# Patient Record
Sex: Male | Born: 1995
Health system: Southern US, Community
[De-identification: ages and names within clinical notes are randomized; demographics above are authoritative.]

## PROBLEM LIST (undated history)

## (undated) DIAGNOSIS — R2231 Localized swelling, mass and lump, right upper limb: Secondary | ICD-10-CM

## (undated) DIAGNOSIS — J302 Other seasonal allergic rhinitis: Secondary | ICD-10-CM

## (undated) DIAGNOSIS — Z8709 Personal history of other diseases of the respiratory system: Secondary | ICD-10-CM

## (undated) HISTORY — PX: TYMPANOSTOMY TUBE PLACEMENT: SHX32

## (undated) HISTORY — PX: TYMPANOPLASTY WITH GRAFT: SHX6567

---
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
## 2015-02-14 ENCOUNTER — Encounter (HOSPITAL_COMMUNITY): Payer: Self-pay | Admitting: Emergency Medicine

## 2015-02-14 DIAGNOSIS — Z8659 Personal history of other mental and behavioral disorders: Secondary | ICD-10-CM | POA: Insufficient documentation

## 2015-02-14 DIAGNOSIS — J45909 Unspecified asthma, uncomplicated: Secondary | ICD-10-CM | POA: Insufficient documentation

## 2015-02-14 DIAGNOSIS — R112 Nausea with vomiting, unspecified: Secondary | ICD-10-CM | POA: Diagnosis not present

## 2015-02-14 DIAGNOSIS — R42 Dizziness and giddiness: Secondary | ICD-10-CM | POA: Insufficient documentation

## 2015-02-14 LAB — COMPREHENSIVE METABOLIC PANEL
ALK PHOS: 67 U/L (ref 38–126)
ALT: 51 U/L (ref 17–63)
AST: 41 U/L (ref 15–41)
Albumin: 4.2 g/dL (ref 3.5–5.0)
Anion gap: 10 (ref 5–15)
BILIRUBIN TOTAL: 0.5 mg/dL (ref 0.3–1.2)
BUN: 13 mg/dL (ref 6–20)
CHLORIDE: 102 mmol/L (ref 101–111)
CO2: 28 mmol/L (ref 22–32)
CREATININE: 1.05 mg/dL (ref 0.61–1.24)
Calcium: 9.6 mg/dL (ref 8.9–10.3)
GFR calc non Af Amer: >60 mL/min (ref >60)
GLUCOSE: 84 mg/dL (ref 65–99)
POTASSIUM: 3.7 mmol/L (ref 3.5–5.1)
SODIUM: 140 mmol/L (ref 135–145)
Total Protein: 7.3 g/dL (ref 6.5–8.1)

## 2015-02-14 LAB — CBC WITH DIFFERENTIAL/PLATELET
Basophils Absolute: 0.1 10*3/uL (ref 0.0–0.1)
Basophils Relative: 1 % (ref 0–1)
EOS ABS: 0.2 10*3/uL (ref 0.0–0.7)
EOS PCT: 2 % (ref 0–5)
HCT: 43.3 % (ref 39.0–52.0)
Hemoglobin: 15.5 g/dL (ref 13.0–17.0)
LYMPHS ABS: 4.6 10*3/uL — AB (ref 0.7–4.0)
LYMPHS PCT: 44 % (ref 12–46)
MCH: 30.3 pg (ref 26.0–34.0)
MCHC: 35.8 g/dL (ref 30.0–36.0)
MCV: 84.7 fL (ref 78.0–100.0)
MONOS PCT: 9 % (ref 3–12)
Monocytes Absolute: 0.9 10*3/uL (ref 0.1–1.0)
Neutro Abs: 4.6 10*3/uL (ref 1.7–7.7)
Neutrophils Relative %: 44 % (ref 43–77)
Platelets: 359 10*3/uL (ref 150–400)
RBC: 5.11 MIL/uL (ref 4.22–5.81)
RDW: 12.8 % (ref 11.5–15.5)
WBC: 10.4 10*3/uL (ref 4.0–10.5)

## 2015-02-14 NOTE — ED Notes (Signed)
Pt. reports emesis onset last night with mild dizziness , denies fever or chills , no diarrhea.

## 2015-02-15 ENCOUNTER — Emergency Department (HOSPITAL_COMMUNITY)
Admission: EM | Admit: 2015-02-15 | Discharge: 2015-02-15 | Disposition: A | Discharge status: Home / Self Care | Payer: BLUE CROSS/BLUE SHIELD | Attending: Emergency Medicine | Admitting: Emergency Medicine

## 2015-02-15 DIAGNOSIS — R42 Dizziness and giddiness: Secondary | ICD-10-CM

## 2015-02-15 MED ORDER — MECLIZINE HCL 25 MG PO TABS: 25.0000 mg | ORAL_TABLET | Freq: Three times a day (TID) | ORAL | Status: DC | PRN

## 2015-02-15 MED ORDER — ONDANSETRON 8 MG PO TBDP: 8.0000 mg | ORAL_TABLET | Freq: Three times a day (TID) | ORAL | Status: DC | PRN

## 2015-02-15 MED ORDER — MECLIZINE HCL 25 MG PO TABS
25.0000 mg | ORAL_TABLET | Freq: Once | ORAL | Status: AC
  Administered 2015-02-15: 25 mg via ORAL
  Filled 2015-02-15: qty 1

## 2015-02-15 MED ORDER — ONDANSETRON 4 MG PO TBDP
8.0000 mg | ORAL_TABLET | Freq: Once | ORAL | Status: AC
  Administered 2015-02-15: 8 mg via ORAL
  Filled 2015-02-15: qty 2

## 2015-02-15 NOTE — ED Provider Notes (Signed)
CSN: [id number]     Arrival date & time 02/14/15  2207 History   First MD Initiated Contact with Patient 02/15/15 0037     Chief Complaint  Patient presents with  . Emesis   HPI Patient presents to the emergency room with complaints of dizziness, nausea and vomiting. Symptoms started a couple days ago. He woke up feeling that objects in the room or moving. He also felt like his balance was off. Those symptoms would come and go throughout the day. He did not recall anything in particular that was bringing on the symptoms. There was not a certain position that precipitated the dizziness. Over the last couple days he's had some intermittent episodes of abdominal cramping but primarily nausea. He denies any diarrhea. He denies any fevers or chills. No recent falls or injuries. No trouble with his vision, speech, strength or sensation. Past Medical History  Diagnosis Date  . Asthma   . Allergy   . ADHD (attention deficit hyperactivity disorder)   . Allergy    Past Surgical History  Procedure Laterality Date  . Tympanostomy tube placement    . Skin graft    . Tubes in ears      No family history on file. History  Substance Use Topics  . Smoking status: Never Smoker   . Smokeless tobacco: Not on file  . Alcohol Use: No    Review of Systems  All other systems reviewed and are negative.     Allergies  Review of patient's allergies indicates no known allergies.  Home Medications   Prior to Admission medications   Not on File   BP 158/94 mmHg  Pulse 57  Temp(Src) 97.9 F (36.6 C) (Oral)  Resp 16  SpO2 99% Physical Exam  Constitutional: He appears well-developed and well-nourished. No distress.  HENT:  Head: Normocephalic and atraumatic.  Right Ear: External ear normal.  Left Ear: External ear normal.  Eyes: Conjunctivae are normal. Right eye exhibits no discharge. Left eye exhibits no discharge. No scleral icterus.  Neck: Neck supple. No tracheal deviation present.   Cardiovascular: Normal rate, regular rhythm and intact distal pulses.   Pulmonary/Chest: Effort normal and breath sounds normal. No stridor. No respiratory distress. He has no wheezes. He has no rales.  Abdominal: Soft. Bowel sounds are normal. He exhibits no distension. There is no tenderness. There is no rebound and no guarding.  Musculoskeletal: He exhibits no edema or tenderness.  Neurological: He is alert. He has normal strength. He displays no tremor. No cranial nerve deficit (no facial droop, extraocular movements intact, no slurred speech) or sensory deficit. He exhibits normal muscle tone. He displays no seizure activity. Coordination and gait normal.  Skin: Skin is warm and dry. No rash noted.  Psychiatric: He has a normal mood and affect.  Nursing note and vitals reviewed.   ED Course  Procedures (including critical care time) Labs Review Labs Reviewed  CBC WITH DIFFERENTIAL/PLATELET - Abnormal; Notable for the following:    Lymphs Abs 4.6 (*)    All other components within normal limits  COMPREHENSIVE METABOLIC PANEL   MDM   Final diagnoses:  Vertigo    Sx are suggestive of peripheral vertigo.  No focal neuro deficits on exam.  Doubt acute CNS event.  Will rx meclizine.  Zofran for nausea.  Follow up with PCP if sx not resolved 1 week    Linwood Dibbles, MD 02/15/15 [phone]

## 2015-02-15 NOTE — Discharge Instructions (Signed)
Benign Positional Vertigo °Vertigo means you feel like you or your surroundings are moving when they are not. Benign positional vertigo is the most common form of vertigo. Benign means that the cause of your condition is not serious. Benign positional vertigo is more common in older adults. °CAUSES  °Benign positional vertigo is the result of an upset in the labyrinth system. This is an area in the middle ear that helps control your balance. This may be caused by a viral infection, head injury, or repetitive motion. However, often no specific cause is found. °SYMPTOMS  °Symptoms of benign positional vertigo occur when you move your head or eyes in different directions. Some of the symptoms may include: °1. Loss of balance and falls. °2. Vomiting. °3. Blurred vision. °4. Dizziness. °5. Nausea. °6. Involuntary eye movements (nystagmus). °DIAGNOSIS  °Benign positional vertigo is usually diagnosed by physical exam. If the specific cause of your benign positional vertigo is unknown, your caregiver may perform imaging tests, such as magnetic resonance imaging (MRI) or computed tomography (CT). °TREATMENT  °Your caregiver may recommend movements or procedures to correct the benign positional vertigo. Medicines such as meclizine, benzodiazepines, and medicines for nausea may be used to treat your symptoms. In rare cases, if your symptoms are caused by certain conditions that affect the inner ear, you may need surgery. °HOME CARE INSTRUCTIONS  °· Follow your caregiver's instructions. °· Move slowly. Do not make sudden body or head movements. °· Avoid driving. °· Avoid operating heavy machinery. °· Avoid performing any tasks that would be dangerous to you or others during a vertigo episode. °· Drink enough fluids to keep your urine clear or pale yellow. °SEEK IMMEDIATE MEDICAL CARE IF:  °· You develop problems with walking, weakness, numbness, or using your arms, hands, or legs. °· You have difficulty speaking. °· You develop  severe headaches. °· Your nausea or vomiting continues or gets worse. °· You develop visual changes. °· Your family or friends notice any behavioral changes. °· Your condition gets worse. °· You have a fever. °· You develop a stiff neck or sensitivity to light. °MAKE SURE YOU:  °· Understand these instructions. °· Will watch your condition. °· Will get help right away if you are not doing well or get worse. °Document Released: 04/15/2006 Document Revised: 09/30/2011 Document Reviewed: 03/28/2011 °ExitCare® Patient Information ©2015 [employer]. This information is not intended to replace advice given to you by your health care provider. Make sure you discuss any questions you have with your health care provider. ° °Epley Maneuver Self-Care °WHAT IS THE EPLEY MANEUVER? °The Epley maneuver is an exercise you can do to relieve symptoms of benign paroxysmal positional vertigo (BPPV). This condition is often just referred to as vertigo. BPPV is caused by the movement of tiny crystals (canaliths) inside your inner ear. The accumulation and movement of canaliths in your inner ear causes a sudden spinning sensation (vertigo) when you move your head to certain positions. Vertigo usually lasts about 30 seconds. BPPV usually occurs in just one ear. If you get vertigo when you lie on your left side, you probably have BPPV in your left ear. Your health care provider can tell you which ear is involved.  °BPPV may be caused by a head injury. Many people older than 50 get BPPV for unknown reasons. If you have been diagnosed with BPPV, your health care provider may teach you how to do this maneuver. BPPV is not life threatening (benign) and usually goes away in time.  °  WHEN SHOULD I PERFORM THE EPLEY MANEUVER? °You can do this maneuver at home whenever you have symptoms of vertigo. You may do the Epley maneuver up to 3 times a day until your symptoms of vertigo go away. °HOW SHOULD I DO THE EPLEY MANEUVER? °7. Sit on the edge of a  bed or table with your back straight. Your legs should be extended or hanging over the edge of the bed or table.   °8. Turn your head halfway toward the affected ear.   °9. Lie backward quickly with your head turned until you are lying flat on your back. You may want to position a pillow under your shoulders.   °10. Hold this position for 30 seconds. You may experience an attack of vertigo. This is normal. Hold this position until the vertigo stops. °11. Then turn your head to the opposite direction until your unaffected ear is facing the floor.   °12. Hold this position for 30 seconds. You may experience an attack of vertigo. This is normal. Hold this position until the vertigo stops. °13. Now turn your whole body to the same side as your head. Hold for another 30 seconds.   °14. You can then sit back up. °ARE THERE RISKS TO THIS MANEUVER? °In some cases, you may have other symptoms (such as changes in your vision, weakness, or numbness). If you have these symptoms, stop doing the maneuver and call your health care provider. Even if doing these maneuvers relieves your vertigo, you may still have dizziness. Dizziness is the sensation of light-headedness but without the sensation of movement. Even though the Epley maneuver may relieve your vertigo, it is possible that your symptoms will return within 5 years. °WHAT SHOULD I DO AFTER THIS MANEUVER? °After doing the Epley maneuver, you can return to your normal activities. Ask your doctor if there is anything you should do at home to prevent vertigo. This may include: °· Sleeping with two or more pillows to keep your head elevated. °· Not sleeping on the side of your affected ear. °· Getting up slowly from bed. °· Avoiding sudden movements during the day. °· Avoiding extreme head movement, like looking up or bending over. °· Wearing a cervical collar to prevent sudden head movements. °WHAT SHOULD I DO IF MY SYMPTOMS GET WORSE? °Call your health care provider if your  vertigo gets worse. Call your provider right way if you have other symptoms, including:  °· Nausea. °· Vomiting. °· Headache. °· Weakness. °· Numbness. °· Vision changes. °Document Released: 07/13/2013 Document Reviewed: 07/13/2013 °ExitCare® Patient Information ©2015 [employer]. This information is not intended to replace advice given to you by your health care provider. Make sure you discuss any questions you have with your health care provider. ° °

## 2015-08-04 ENCOUNTER — Encounter (HOSPITAL_COMMUNITY): Payer: Self-pay | Admitting: Family Medicine

## 2015-08-04 ENCOUNTER — Emergency Department (HOSPITAL_COMMUNITY)
Admission: EM | Admit: 2015-08-04 | Discharge: 2015-08-04 | Disposition: A | Discharge status: Home / Self Care | Payer: Worker's Compensation | Attending: Emergency Medicine | Admitting: Emergency Medicine

## 2015-08-04 DIAGNOSIS — Y9289 Other specified places as the place of occurrence of the external cause: Secondary | ICD-10-CM | POA: Diagnosis not present

## 2015-08-04 DIAGNOSIS — S61231A Puncture wound without foreign body of left index finger without damage to nail, initial encounter: Secondary | ICD-10-CM | POA: Insufficient documentation

## 2015-08-04 DIAGNOSIS — S6992XA Unspecified injury of left wrist, hand and finger(s), initial encounter: Secondary | ICD-10-CM | POA: Diagnosis present

## 2015-08-04 DIAGNOSIS — Y9389 Activity, other specified: Secondary | ICD-10-CM | POA: Diagnosis not present

## 2015-08-04 DIAGNOSIS — S61412A Laceration without foreign body of left hand, initial encounter: Secondary | ICD-10-CM | POA: Insufficient documentation

## 2015-08-04 DIAGNOSIS — Y998 Other external cause status: Secondary | ICD-10-CM | POA: Insufficient documentation

## 2015-08-04 DIAGNOSIS — W311XXA Contact with metalworking machines, initial encounter: Secondary | ICD-10-CM | POA: Diagnosis not present

## 2015-08-04 MED ORDER — CEPHALEXIN 500 MG PO CAPS: 500.0000 mg | ORAL_CAPSULE | Freq: Two times a day (BID) | ORAL | Status: DC

## 2015-08-04 MED ORDER — LIDOCAINE HCL 2 % IJ SOLN
20.0000 mL | Freq: Once | INTRAMUSCULAR | Status: AC
  Administered 2015-08-04: 400 mg
  Filled 2015-08-04: qty 20

## 2015-08-04 NOTE — ED Provider Notes (Signed)
By signing my name below, I, Brent Gilmore, attest that this documentation has been prepared under the direction and in the presence of [name], PA-C Electronically Signed: [name], ED Scribe. 08/04/2015. 3:06 PM.  Patient is a 20 y.o. male presenting with hand injury. The history is provided by the patient.  Hand Injury Location:  Hand Hand location:  L palm  Shed L. Brent Gilmore is a 20 y.o. male who presents to the Emergency Department complaining of left hand injury occurring PTA. He was drilling through a plate and holding it from below. The drill bit punched through the other side and into his left index finger. The puncture wound is at the base of the palmar aspect of his finger. He denies weakness, numbness, tingling or loss of sensation of the digit. Pt was seen by Dr. [name] at occupational health clinic who called Dr. [name]. Dr. [name] instructed pt to come to ED and he will be evaluated by him here. Pt didn't have any xrays at the time of the incident. He notes that he has tried applying pressure to the area for the relief of his symptoms. Bleeding was easily controlled with pressure. He has no other complaints at this time.   Review of Systems  Skin:       Wound  All other systems reviewed and are negative.   Physical Exam  Constitutional: He is well-developed, well-nourished, and in no distress.  HENT:  Head: Normocephalic and atraumatic.  Eyes: Conjunctivae are normal. Right eye exhibits no discharge. Left eye exhibits no discharge.  Neck: Normal range of motion.  Cardiovascular: Normal rate and intact distal pulses.   Cap refill < 3 seconds  Pulmonary/Chest: Effort normal. No respiratory distress.  Musculoskeletal: Normal range of motion.       Left hand: He exhibits tenderness and laceration. He exhibits normal range of motion, normal capillary refill and no deformity. Normal sensation noted. Normal strength noted.       Hands: FROM of digits and wrist of left hand.  Tenderness over site of puncture wound. No swelling or deformity. Sensation intact over the digit.   Neurological: He is alert. Coordination normal. GCS score is 15.  Skin: Skin is warm and dry.  1 cm puncture wound noted to palmar aspect at base of left index finger. Bleeding controlled with pressure. No foreign bodies visualized.   Psychiatric: Mood and affect normal.  Nursing note and vitals reviewed.   DIAGNOSTIC STUDIES: Oxygen Saturation is 100% on RA, nl by my interpretation.    COORDINATION OF CARE: 3:06 PM Discussed treatment plan with pt at bedside which includes consult to Dr. [name] and pt agreed to plan.   MDM Number of Diagnoses or Management Options Hand laceration, left, initial encounter:   20 year old male presenting with puncture wound to left hand at base of index finger on palmar aspect. Pt initially seen by his occupational health Dr. [name] who consulted hand surgery. Dr. [name] recommended pt transferred to ED for evaluation. Dr. [name] in ED at time of patient's arrival. Patient assessed and wound repaired by him. Dr. [name] recommends keflex and outpatient follow up. Return precautions given in discharge paperwork and discussed with pt at bedside. Pt stable for discharge  I personally performed the services described in this documentation, which was scribed in my presence. The recorded information has been reviewed and is accurate.    Rolm GalaStevi Spencer [name], PA-C 08/04/15 1805  [name], MD 08/05/15 [phone]

## 2015-08-04 NOTE — Discharge Instructions (Signed)

## 2015-08-04 NOTE — ED Notes (Signed)
Pt here for left pointer index finger injury. sts a drill bit went into finger. Pt sensory in tact. Sent here to see Dr. [name].

## 2015-08-04 NOTE — ED Notes (Signed)
Dr Izora Ribascoley at the bedside

## 2016-05-10 DIAGNOSIS — M79644 Pain in right finger(s): Secondary | ICD-10-CM | POA: Diagnosis not present

## 2016-05-10 DIAGNOSIS — M67441 Ganglion, right hand: Secondary | ICD-10-CM | POA: Diagnosis not present

## 2016-05-31 DIAGNOSIS — R2231 Localized swelling, mass and lump, right upper limb: Secondary | ICD-10-CM | POA: Diagnosis not present

## 2016-05-31 DIAGNOSIS — R229 Localized swelling, mass and lump, unspecified: Secondary | ICD-10-CM | POA: Diagnosis not present

## 2016-05-31 DIAGNOSIS — M6740 Ganglion, unspecified site: Secondary | ICD-10-CM | POA: Diagnosis not present

## 2016-06-03 ENCOUNTER — Other Ambulatory Visit: Payer: Self-pay | Admitting: Orthopedic Surgery

## 2016-06-03 DIAGNOSIS — IMO0002 Reserved for concepts with insufficient information to code with codable children: Secondary | ICD-10-CM

## 2016-06-03 DIAGNOSIS — R229 Localized swelling, mass and lump, unspecified: Principal | ICD-10-CM

## 2016-06-21 ENCOUNTER — Ambulatory Visit
Admission: RE | Admit: 2016-06-21 | Discharge: 2016-06-21 | Disposition: A | Discharge status: Home / Self Care | Payer: BLUE CROSS/BLUE SHIELD | Source: Ambulatory Visit | Attending: Orthopedic Surgery | Admitting: Orthopedic Surgery

## 2016-06-21 DIAGNOSIS — R229 Localized swelling, mass and lump, unspecified: Principal | ICD-10-CM

## 2016-06-21 DIAGNOSIS — IMO0002 Reserved for concepts with insufficient information to code with codable children: Secondary | ICD-10-CM

## 2016-06-21 DIAGNOSIS — R2231 Localized swelling, mass and lump, right upper limb: Secondary | ICD-10-CM

## 2016-06-21 HISTORY — DX: Localized swelling, mass and lump, right upper limb: R22.31

## 2016-06-21 MED ORDER — GADOBENATE DIMEGLUMINE 529 MG/ML IV SOLN
20.0000 mL | Freq: Once | INTRAVENOUS | Status: AC | PRN
  Administered 2016-06-21: 20 mL via INTRAVENOUS

## 2016-06-28 ENCOUNTER — Encounter (HOSPITAL_BASED_OUTPATIENT_CLINIC_OR_DEPARTMENT_OTHER): Payer: Self-pay | Admitting: *Deleted

## 2016-06-28 ENCOUNTER — Other Ambulatory Visit: Payer: Self-pay | Admitting: Orthopedic Surgery

## 2016-06-28 DIAGNOSIS — R229 Localized swelling, mass and lump, unspecified: Secondary | ICD-10-CM | POA: Diagnosis not present

## 2016-07-02 ENCOUNTER — Ambulatory Visit (HOSPITAL_BASED_OUTPATIENT_CLINIC_OR_DEPARTMENT_OTHER)
Admission: RE | Admit: 2016-07-02 | Discharge: 2016-07-02 | Disposition: A | Discharge status: Home / Self Care | Payer: BLUE CROSS/BLUE SHIELD | Source: Ambulatory Visit | Attending: Orthopedic Surgery | Admitting: Orthopedic Surgery

## 2016-07-02 ENCOUNTER — Encounter (HOSPITAL_BASED_OUTPATIENT_CLINIC_OR_DEPARTMENT_OTHER)
Admission: RE | Disposition: A | Discharge status: Home / Self Care | Payer: Self-pay | Source: Ambulatory Visit | Attending: Orthopedic Surgery

## 2016-07-02 ENCOUNTER — Encounter (HOSPITAL_BASED_OUTPATIENT_CLINIC_OR_DEPARTMENT_OTHER): Payer: Self-pay | Admitting: Anesthesiology

## 2016-07-02 ENCOUNTER — Ambulatory Visit (HOSPITAL_BASED_OUTPATIENT_CLINIC_OR_DEPARTMENT_OTHER): Payer: BLUE CROSS/BLUE SHIELD | Admitting: Anesthesiology

## 2016-07-02 DIAGNOSIS — I82601 Acute embolism and thrombosis of unspecified veins of right upper extremity: Secondary | ICD-10-CM | POA: Diagnosis not present

## 2016-07-02 DIAGNOSIS — R2231 Localized swelling, mass and lump, right upper limb: Secondary | ICD-10-CM | POA: Diagnosis not present

## 2016-07-02 DIAGNOSIS — D1809 Hemangioma of other sites: Secondary | ICD-10-CM | POA: Diagnosis not present

## 2016-07-02 DIAGNOSIS — E669 Obesity, unspecified: Secondary | ICD-10-CM | POA: Diagnosis not present

## 2016-07-02 DIAGNOSIS — Z6836 Body mass index (BMI) 36.0-36.9, adult: Secondary | ICD-10-CM | POA: Insufficient documentation

## 2016-07-02 HISTORY — DX: Other seasonal allergic rhinitis: J30.2

## 2016-07-02 HISTORY — PX: MASS EXCISION: SHX2000

## 2016-07-02 HISTORY — DX: Localized swelling, mass and lump, right upper limb: R22.31

## 2016-07-02 HISTORY — DX: Personal history of other diseases of the respiratory system: Z87.09

## 2016-07-02 SURGERY — EXCISION MASS
Anesthesia: Regional | Site: Thumb | Laterality: Right

## 2016-07-02 MED ORDER — MIDAZOLAM HCL 2 MG/2ML IJ SOLN
INTRAMUSCULAR | Status: AC
  Filled 2016-07-02: qty 2

## 2016-07-02 MED ORDER — SCOPOLAMINE 1 MG/3DAYS TD PT72: 1.0000 | MEDICATED_PATCH | Freq: Once | TRANSDERMAL | Status: DC | PRN

## 2016-07-02 MED ORDER — HYDROCODONE-ACETAMINOPHEN 5-325 MG PO TABS
1.0000 | ORAL_TABLET | Freq: Four times a day (QID) | ORAL | 0 refills | Status: DC | PRN
Start: 2016-07-02 — End: 2017-01-31

## 2016-07-02 MED ORDER — PROPOFOL 10 MG/ML IV BOLUS
INTRAVENOUS | Status: AC
  Filled 2016-07-02: qty 20

## 2016-07-02 MED ORDER — CHLORHEXIDINE GLUCONATE 4 % EX LIQD
60.0000 mL | Freq: Once | CUTANEOUS | Status: DC
Start: 2016-07-02 — End: 2016-07-02

## 2016-07-02 MED ORDER — BUPIVACAINE HCL (PF) 0.25 % IJ SOLN
INTRAMUSCULAR | Status: DC | PRN
  Administered 2016-07-02: 6 mL

## 2016-07-02 MED ORDER — LACTATED RINGERS IV SOLN
INTRAVENOUS | Status: DC
  Administered 2016-07-02: 12:00:00 via INTRAVENOUS

## 2016-07-02 MED ORDER — HYDROCODONE-ACETAMINOPHEN 7.5-325 MG PO TABS: 1.0000 | ORAL_TABLET | Freq: Once | ORAL | Status: DC | PRN

## 2016-07-02 MED ORDER — FENTANYL CITRATE (PF) 100 MCG/2ML IJ SOLN: 25.0000 ug | INTRAMUSCULAR | Status: DC | PRN

## 2016-07-02 MED ORDER — LIDOCAINE HCL (PF) 0.5 % IJ SOLN
INTRAMUSCULAR | Status: DC | PRN
  Administered 2016-07-02: 30 mL via INTRAVENOUS

## 2016-07-02 MED ORDER — MIDAZOLAM HCL 2 MG/2ML IJ SOLN
1.0000 mg | INTRAMUSCULAR | Status: DC | PRN
  Administered 2016-07-02: 2 mg via INTRAVENOUS

## 2016-07-02 MED ORDER — CEFAZOLIN SODIUM-DEXTROSE 2-4 GM/100ML-% IV SOLN
2.0000 g | INTRAVENOUS | Status: AC
  Administered 2016-07-02: 2 g via INTRAVENOUS

## 2016-07-02 MED ORDER — FENTANYL CITRATE (PF) 100 MCG/2ML IJ SOLN
50.0000 ug | INTRAMUSCULAR | Status: DC | PRN
Start: 2016-07-02 — End: 2016-07-02
  Administered 2016-07-02: 100 ug via INTRAVENOUS

## 2016-07-02 MED ORDER — FENTANYL CITRATE (PF) 100 MCG/2ML IJ SOLN
INTRAMUSCULAR | Status: AC
  Filled 2016-07-02: qty 2

## 2016-07-02 MED ORDER — CEFAZOLIN SODIUM-DEXTROSE 2-4 GM/100ML-% IV SOLN
INTRAVENOUS | Status: AC
  Filled 2016-07-02: qty 100

## 2016-07-02 MED ORDER — PROPOFOL 10 MG/ML IV BOLUS
INTRAVENOUS | Status: DC | PRN
  Administered 2016-07-02: 20 mg via INTRAVENOUS
  Administered 2016-07-02 (×2): 50 mg via INTRAVENOUS

## 2016-07-02 MED ORDER — ONDANSETRON HCL 4 MG/2ML IJ SOLN: 4.0000 mg | Freq: Once | INTRAMUSCULAR | Status: DC | PRN

## 2016-07-02 MED ORDER — ONDANSETRON HCL 4 MG/2ML IJ SOLN
INTRAMUSCULAR | Status: DC | PRN
  Administered 2016-07-02: 4 mg via INTRAVENOUS

## 2016-07-02 SURGICAL SUPPLY — 42 items
BANDAGE COBAN STERILE 2 (GAUZE/BANDAGES/DRESSINGS) IMPLANT
BLADE SURG 15 STRL LF DISP TIS (BLADE) ×1 IMPLANT
BLADE SURG 15 STRL SS (BLADE) ×2
BNDG CMPR 9X4 STRL LF SNTH (GAUZE/BANDAGES/DRESSINGS)
BNDG COHESIVE 1X5 TAN STRL LF (GAUZE/BANDAGES/DRESSINGS) ×1 IMPLANT
BNDG COHESIVE 3X5 TAN STRL LF (GAUZE/BANDAGES/DRESSINGS) IMPLANT
BNDG ESMARK 4X9 LF (GAUZE/BANDAGES/DRESSINGS) IMPLANT
BNDG GAUZE ELAST 4 BULKY (GAUZE/BANDAGES/DRESSINGS) IMPLANT
CHLORAPREP W/TINT 26ML (MISCELLANEOUS) ×2 IMPLANT
CORDS BIPOLAR (ELECTRODE) ×2 IMPLANT
COVER BACK TABLE 60X90IN (DRAPES) ×2 IMPLANT
COVER MAYO STAND STRL (DRAPES) ×2 IMPLANT
CUFF TOURNIQUET SINGLE 18IN (TOURNIQUET CUFF) ×1 IMPLANT
DECANTER SPIKE VIAL GLASS SM (MISCELLANEOUS) IMPLANT
DRAIN PENROSE 1/2X12 LTX STRL (WOUND CARE) IMPLANT
DRAPE EXTREMITY T 121X128X90 (DRAPE) ×2 IMPLANT
DRAPE SURG 17X23 STRL (DRAPES) ×2 IMPLANT
GAUZE SPONGE 4X4 12PLY STRL (GAUZE/BANDAGES/DRESSINGS) ×2 IMPLANT
GAUZE XEROFORM 1X8 LF (GAUZE/BANDAGES/DRESSINGS) ×2 IMPLANT
GLOVE BIOGEL PI IND STRL 8.5 (GLOVE) ×1 IMPLANT
GLOVE BIOGEL PI INDICATOR 8.5 (GLOVE) ×1
GLOVE SURG ORTHO 8.0 STRL STRW (GLOVE) ×2 IMPLANT
GOWN STRL REUS W/ TWL LRG LVL3 (GOWN DISPOSABLE) ×1 IMPLANT
GOWN STRL REUS W/TWL LRG LVL3 (GOWN DISPOSABLE) ×2
GOWN STRL REUS W/TWL XL LVL3 (GOWN DISPOSABLE) ×2 IMPLANT
NDL PRECISIONGLIDE 27X1.5 (NEEDLE) ×1 IMPLANT
NDL SAFETY ECLIPSE 18X1.5 (NEEDLE) IMPLANT
NEEDLE HYPO 18GX1.5 SHARP (NEEDLE)
NEEDLE PRECISIONGLIDE 27X1.5 (NEEDLE) ×2 IMPLANT
NS IRRIG 1000ML POUR BTL (IV SOLUTION) ×2 IMPLANT
PACK BASIN DAY SURGERY FS (CUSTOM PROCEDURE TRAY) ×2 IMPLANT
PAD CAST 3X4 CTTN HI CHSV (CAST SUPPLIES) IMPLANT
PADDING CAST COTTON 3X4 STRL (CAST SUPPLIES)
SPLINT PLASTER CAST XFAST 3X15 (CAST SUPPLIES) IMPLANT
SPLINT PLASTER XTRA FASTSET 3X (CAST SUPPLIES)
STOCKINETTE 4X48 STRL (DRAPES) ×2 IMPLANT
SUT ETHILON 4 0 PS 2 18 (SUTURE) ×2 IMPLANT
SUT VIC AB 4-0 P2 18 (SUTURE) IMPLANT
SYR BULB 3OZ (MISCELLANEOUS) ×2 IMPLANT
SYR CONTROL 10ML LL (SYRINGE) ×2 IMPLANT
TOWEL OR 17X24 6PK STRL BLUE (TOWEL DISPOSABLE) ×2 IMPLANT
UNDERPAD 30X30 (UNDERPADS AND DIAPERS) ×2 IMPLANT

## 2016-07-02 NOTE — Anesthesia Preprocedure Evaluation (Addendum)
Anesthesia Evaluation  Patient identified by MRN, date of birth, ID band Patient awake    Reviewed: Allergy & Precautions, NPO status , Patient's Chart, lab work & pertinent test results  Airway Mallampati: II  TM Distance: >3 FB Neck ROM: Full    Dental  (+) Teeth Intact   Pulmonary neg pulmonary ROS,    Pulmonary exam normal breath sounds clear to auscultation       Cardiovascular negative cardio ROS Normal cardiovascular exam Rhythm:Regular Rate:Normal     Neuro/Psych negative neurological ROS  negative psych ROS   GI/Hepatic negative GI ROS, Neg liver ROS,   Endo/Other  Obesity  Renal/GU negative Renal ROS  negative genitourinary   Musculoskeletal Mass right thumb   Abdominal (+) + obese,   Peds  Hematology negative hematology ROS (+)   Anesthesia Other Findings   Reproductive/Obstetrics                            Anesthesia Physical Anesthesia Plan  ASA: II  Anesthesia Plan: Bier Block   Post-op Pain Management:    Induction: Intravenous  Airway Management Planned: Natural Airway and Nasal Cannula  Additional Equipment:   Intra-op Plan:   Post-operative Plan:   Informed Consent: I have reviewed the patients History and Physical, chart, labs and discussed the procedure including the risks, benefits and alternatives for the proposed anesthesia with the patient or authorized representative who has indicated his/her understanding and acceptance.     Plan Discussed with: CRNA, Anesthesiologist and Surgeon  Anesthesia Plan Comments:         Anesthesia Quick Evaluation

## 2016-07-02 NOTE — Op Note (Signed)
  Dictation Number [id number]

## 2016-07-02 NOTE — Discharge Instructions (Addendum)
[facility] Instructions °Hand Surgery ° °Wound Care: °Keep your hand elevated above the level of your heart.  Do not allow it to dangle by your side.  Keep the dressing dry and do not remove it unless your doctor advises you to do so.  He will usually change it at the time of your post-op visit.  Moving your fingers is advised to stimulate circulation but will depend on the site of your surgery.  If you have a splint applied, your doctor will advise you regarding movement. ° °Activity: °Do not drive or operate machinery today.  Rest today and then you may return to your normal activity and work as indicated by your physician. ° °Diet:  °Drink liquids today or eat a light diet.  You may resume a regular diet tomorrow.   ° ° ° °Post Anesthesia Home Care Instructions ° °Activity: °Get plenty of rest for the remainder of the day. A responsible adult should stay with you for 24 hours following the procedure.  °For the next 24 hours, DO NOT: °-Drive a car °-Operate machinery °-Drink alcoholic beverages °-Take any medication unless instructed by your physician °-Make any legal decisions or sign important papers. ° °Meals: °Start with liquid foods such as gelatin or soup. Progress to regular foods as tolerated. Avoid greasy, spicy, heavy foods. If nausea and/or vomiting occur, drink only clear liquids until the nausea and/or vomiting subsides. Call your physician if vomiting continues. ° °Special Instructions/Symptoms: °Your throat may feel dry or sore from the anesthesia or the breathing tube placed in your throat during surgery. If this causes discomfort, gargle with warm salt water. The discomfort should disappear within 24 hours. ° °If you had a scopolamine patch placed behind your ear for the management of post- operative nausea and/or vomiting: ° °1. The medication in the patch is effective for 72 hours, after which it should be removed.  Wrap patch in a tissue and discard in the trash. Wash hands thoroughly with  soap and water. °2. You may remove the patch earlier than 72 hours if you experience unpleasant side effects which may include dry mouth, dizziness or visual disturbances. °3. Avoid touching the patch. Wash your hands with soap and water after contact with the patch. °  ° °General expectations: °Pain for two to three days. °Fingers may become slightly swollen. ° °Call your doctor if any of the following occur: °Severe pain not relieved by pain medication. °Elevated temperature. °Dressing soaked with blood. °Inability to move fingers. °White or bluish color to fingers. ° °

## 2016-07-02 NOTE — Transfer of Care (Signed)
Immediate Anesthesia Transfer of Care Note  Patient: Brent Gilmore  Procedure(s) Performed: Procedure(s) with comments: EXCISION MASS right thumb (Right) - FAB  Patient Location: PACU  Anesthesia Type:MAC and Bier block  Level of Consciousness: awake, alert  and oriented  Airway & Oxygen Therapy: Patient Spontanous Breathing and Patient connected to face mask oxygen  Post-op Assessment: Report given to RN and Post -op Vital signs reviewed and stable  Post vital signs: Reviewed and stable  Last Vitals:  Vitals:   07/02/16 1145  BP: (!) 148/93  Pulse: 64  Resp: 18  Temp: 36.6 C    Last Pain:  Vitals:   07/02/16 1145  TempSrc: Oral      Patients Stated Pain Goal: 0 (07/02/16 1145)  Complications: No apparent anesthesia complications

## 2016-07-02 NOTE — Brief Op Note (Signed)
07/02/2016  1:50 PM  PATIENT:  Walden Fieldameron L Taft  20 y.o. male  PRE-OPERATIVE DIAGNOSIS:  mass right thumb  POST-OPERATIVE DIAGNOSIS:  mass right thumb  PROCEDURE:  Procedure(s) with comments: EXCISION MASS right thumb (Right) - FAB  SURGEON:  Surgeon(s) and Role:    * Cindee SaltGary Tiffeny Minchew, MD - Primary  PHYSICIAN ASSISTANT:   ASSISTANTS: none   ANESTHESIA:   local and regional  EBL:  Total I/O In: 300 [I.V.:300] Out: -   BLOOD ADMINISTERED:none  DRAINS: none   LOCAL MEDICATIONS USED:  BUPIVICAINE   SPECIMEN:  Excision  DISPOSITION OF SPECIMEN:  PATHOLOGY  COUNTS:  YES  TOURNIQUET:   Total Tourniquet Time Documented: Forearm (Right) - 17 minutes Total: Forearm (Right) - 17 minutes   DICTATION: .Other Dictation: Dictation Number [id number]  PLAN OF CARE: Discharge to home after PACU  PATIENT DISPOSITION:  PACU - hemodynamically stable.

## 2016-07-02 NOTE — H&P (Signed)
  Brent Gilmore is an 20 y.o. male.   Chief Complaint: massright thumb HPI: Brent LangCameron is a 20 year old right-hand-dominant male who comes in with a complaint of a mass on the proximal phalanx of his right thumb. He states this been present for the past 7-8 years. He thinks that it occurred while playing football. It is swollen recently was causing pain to the extent that it was aspirated and went to the urgent care centers. This returned only blood and clot. He complains of a sharp pain which has essentially resolved with a VAS score of 8/10. He is not taking anything for this. He states that it is gradually been enlarging. Is not complaining of any numbness or tingling. He has no history of diabetes thyroid problems arthritis or gout. Family history is positive diabetes and thyroid problems negative for arthritis and gout. He has been tested for diabetes but not recently. He has had his MRI done.His MRI is reviewed with him this was read by Dr. Jena GaussMaxwell. He feeling is that it is probably an epidermal inclusion cyst.              Past Medical History:  Diagnosis Date  . Finger mass, right 06/2016   thumb  . History of asthma    allergy-induced; no current med.  . Seasonal allergies     Past Surgical History:  Procedure Laterality Date  . TYMPANOPLASTY WITH GRAFT Right   . TYMPANOSTOMY TUBE PLACEMENT Bilateral     History reviewed. No pertinent family history. Social History:  reports that he has never smoked. He has never used smokeless tobacco. He reports that he does not drink alcohol or use drugs.  Allergies: No Known Allergies  No prescriptions prior to admission.    No results found for this or any previous visit (from the past 48 hour(s)).  No results found.   Pertinent items are noted in HPI.  Height 5\' 9"  (1.753 m), weight 113.4 kg (250 lb).  General appearance: alert, cooperative and appears stated age Head: Normocephalic, without obvious abnormality Neck: no  JVD Resp: clear to auscultation bilaterally Cardio: regular rate and rhythm, S1, S2 normal, no murmur, click, rub or gallop GI: soft, non-tender; bowel sounds normal; no masses,  no organomegaly Extremities: mass right thumb Pulses: 2+ and symmetric Skin: Skin color, texture, turgor normal. No rashes or lesions Neurologic: Grossly normal Incision/Wound: na  Assessment/Plan Assessment:  1. Mass    Plan: He would like to have this excised. Pre-peri-and postoperative course are discussed along with risk applications. He is aware that there is no guarantee to the surgery the possibility of infection recurrence injury to arteries nerves tendons incomplete release symptoms dystrophy possibility of recurrence. He is scheduled for excision mass right thumb as an outpatient under regional anesthesia.      Brent Gilmore R 07/02/2016, 9:56 AM

## 2016-07-02 NOTE — Anesthesia Postprocedure Evaluation (Signed)
Anesthesia Post Note  Patient: Walden FieldCameron L Stiger  Procedure(s) Performed: Procedure(s) (LRB): EXCISION MASS right thumb (Right)  Patient location during evaluation: PACU Anesthesia Type: General Level of consciousness: awake and alert and oriented Pain management: pain level controlled Vital Signs Assessment: post-procedure vital signs reviewed and stable Respiratory status: spontaneous breathing, nonlabored ventilation and respiratory function stable Cardiovascular status: blood pressure returned to baseline and stable Postop Assessment: no signs of nausea or vomiting Anesthetic complications: no    Last Vitals:  Vitals:   07/02/16 1145 07/02/16 1353  BP: (!) 148/93 137/78  Pulse: 64 95  Resp: 18 (P) 20  Temp: 36.6 C (P) 36.6 C    Last Pain:  Vitals:   07/02/16 1353  TempSrc:   PainSc: (P) 0-No pain                 Wenda Vanschaick A.

## 2016-07-03 ENCOUNTER — Encounter (HOSPITAL_BASED_OUTPATIENT_CLINIC_OR_DEPARTMENT_OTHER): Payer: Self-pay | Admitting: Orthopedic Surgery

## 2016-07-04 NOTE — Op Note (Signed)
NAMPhilis Pique:  Schroepfer, Abdulhadi                 ACCOUNT NO.:  [id number]  MEDICAL RECORD NO.:  [id number]  LOCATION:                                 FACILITY:  PHYSICIAN:  Cindee SaltGary Jaelynn Pozo, M.D.            DATE OF BIRTH:  DATE OF PROCEDURE:  07/02/2016 DATE OF DISCHARGE:                              OPERATIVE REPORT   Brent LangCameron has a mass on the proximal phalanx of his right thumb.  This has been enlarging, it is not painful for him.  It is soft and nontender to palpation.  He has had an MRI done read out as a possible epidermal inclusion cyst.  He was advised that there was fluid about it that this may be a vascular tumor rather than an inclusion cyst, it is not a cyst.  He is desirous having this removed.  Pre, peri and postoperative course have been discussed along with risks and complications.  He is aware that there is no guarantee to the surgery; the possibility of infection; recurrence of injury to arteries, nerves, tendons; incomplete relief of symptoms and dystrophy.  In the preoperative area, the patient was seen, the extremity marked by both the patient and surgeon and antibiotic given.  PROCEDURE IN DETAIL:  The patient was brought to the operating room, where a forearm-based IV regional anesthetic was carried out without difficulty under the direction of the Anesthesia department.  He was prepped using ChloraPrep in a supine position with the right arm free. A 3-minute dry time was allowed and time-out taken confirming the patient and procedure.  A longitudinal incision was made mid lateral of his thumb and carried down through subcutaneous tissue.  A multilobulated bluish tumor was immediately encountered.  A feeding vessel was found proximally and distally.  These were electrocauterized with bipolar.  The specimen was excised in total after isolating it with blunt dissection.  It measured approximately 1.1 cm in diameter.  This was sent to Pathology.  The wound was copiously irrigated  with saline. The dorsal sensory nerve was not seen in the wound when it was looked for.  The wound was irrigated with saline.  The skin was closed with interrupted 4-0 nylon sutures.  A local infiltration metacarpal block was given with 0.25% bupivacaine without epinephrine, approximately 6 mL was used.  A sterile compressive dressing and splint to the thumb was applied.  The patient tolerated the procedure well.  On deflation of the tourniquet, all fingers immediately pinked.  He was taken to the recovery room for observation in satisfactory condition.  He will be discharged to home [facility] on [place].          ______________________________ Cindee SaltGary Kristilyn Coltrane, M.D.     GK/MEDQ  D:  07/02/2016  T:  07/03/2016  Job:  [id number]

## 2016-08-21 DIAGNOSIS — R05 Cough: Secondary | ICD-10-CM | POA: Diagnosis not present

## 2016-08-21 DIAGNOSIS — J019 Acute sinusitis, unspecified: Secondary | ICD-10-CM | POA: Diagnosis not present

## 2017-01-30 ENCOUNTER — Encounter (HOSPITAL_BASED_OUTPATIENT_CLINIC_OR_DEPARTMENT_OTHER): Payer: Self-pay | Admitting: *Deleted

## 2017-01-30 DIAGNOSIS — H60312 Diffuse otitis externa, left ear: Secondary | ICD-10-CM | POA: Insufficient documentation

## 2017-01-30 DIAGNOSIS — J45909 Unspecified asthma, uncomplicated: Secondary | ICD-10-CM | POA: Diagnosis not present

## 2017-01-30 DIAGNOSIS — H9202 Otalgia, left ear: Secondary | ICD-10-CM | POA: Diagnosis present

## 2017-01-30 DIAGNOSIS — H6092 Unspecified otitis externa, left ear: Secondary | ICD-10-CM | POA: Diagnosis not present

## 2017-01-30 MED ORDER — IBUPROFEN 800 MG PO TABS
800.0000 mg | ORAL_TABLET | Freq: Once | ORAL | Status: AC
  Administered 2017-01-30: 800 mg via ORAL
  Filled 2017-01-30: qty 1

## 2017-01-30 NOTE — ED Triage Notes (Signed)
Pain in his left ear. He was given a rx for amoxicillin and Ibuprofen yesterday. He has not had Ibuprofen in 11 hours.

## 2017-01-31 ENCOUNTER — Emergency Department (HOSPITAL_BASED_OUTPATIENT_CLINIC_OR_DEPARTMENT_OTHER)
Admission: EM | Admit: 2017-01-31 | Discharge: 2017-01-31 | Disposition: A | Discharge status: Home / Self Care | Payer: BLUE CROSS/BLUE SHIELD | Attending: Emergency Medicine | Admitting: Emergency Medicine

## 2017-01-31 DIAGNOSIS — H60312 Diffuse otitis externa, left ear: Secondary | ICD-10-CM

## 2017-01-31 MED ORDER — HYDROCODONE-ACETAMINOPHEN 5-325 MG PO TABS: 1.0000 | ORAL_TABLET | ORAL | 0 refills | Status: AC | PRN

## 2017-01-31 MED ORDER — CIPROFLOXACIN-DEXAMETHASONE 0.3-0.1 % OT SUSP: 4.0000 [drp] | Freq: Two times a day (BID) | OTIC | 0 refills | Status: AC

## 2017-01-31 MED ORDER — CIPROFLOXACIN-DEXAMETHASONE 0.3-0.1 % OT SUSP
4.0000 [drp] | Freq: Once | OTIC | Status: AC
  Administered 2017-01-31: 4 [drp] via OTIC
  Filled 2017-01-31: qty 7.5

## 2017-01-31 MED ORDER — ACETAMINOPHEN 325 MG PO TABS
650.0000 mg | ORAL_TABLET | Freq: Once | ORAL | Status: AC
  Administered 2017-01-31: 650 mg via ORAL
  Filled 2017-01-31: qty 2

## 2017-01-31 MED ORDER — CIPROFLOXACIN-HYDROCORTISONE 0.2-1 % OT SUSP
3.0000 [drp] | Freq: Two times a day (BID) | OTIC | Status: DC
  Filled 2017-01-31: qty 10

## 2017-01-31 MED ORDER — IBUPROFEN 400 MG PO TABS: 400.0000 mg | ORAL_TABLET | Freq: Three times a day (TID) | ORAL | 0 refills | Status: AC | PRN

## 2017-01-31 MED ORDER — HYDROCODONE-ACETAMINOPHEN 5-325 MG PO TABS
1.0000 | ORAL_TABLET | Freq: Once | ORAL | Status: AC
  Administered 2017-01-31: 1 via ORAL
  Filled 2017-01-31: qty 1

## 2017-01-31 NOTE — ED Notes (Signed)
ED Provider at bedside. 

## 2017-01-31 NOTE — ED Notes (Signed)
Patient went to MD's office complaining of earache left side.  He was prescribed with Amoxicillin 875 mg BID and Ibuprofen 800 mg TID and it feels worst than it was.  Left side of the his face and neck is swollen, red and tender to touch.  Slight yellowish discharge to his left ear noted.  He stated that he feels a pop, a shooting pain and it drains.

## 2017-01-31 NOTE — ED Provider Notes (Signed)
[facility] Provider Note   CSN: [id number] Arrival date & time: 01/30/17  2256     History   Chief Complaint Chief Complaint  Patient presents with  . Otalgia    HPI Brent Gilmore is a 21 y.o. male.  HPI Patient reports being started on amoxicillin 2 days ago for left ear pain and was diagnosed with acute otitis media.  He presents emergency department today for increasing left ear pain and discharge from his left ear with swelling of his left preauricular space.  This pain has worsened as well.  No fevers or chills.  Pain is moderate to severe in severity   Past Medical History:  Diagnosis Date  . Finger mass, right 06/2016   thumb  . History of asthma    allergy-induced; no current med.  . Seasonal allergies     There are no active problems to display for this patient.   Past Surgical History:  Procedure Laterality Date  . MASS EXCISION Right 07/02/2016   Procedure: EXCISION MASS right thumb;  Surgeon: Cindee SaltGary Kuzma, MD;  Location: [facility];  Service: Orthopedics;  Laterality: Right;  FAB  . TYMPANOPLASTY WITH GRAFT Right   . TYMPANOSTOMY TUBE PLACEMENT Bilateral        Home Medications    Prior to Admission medications   Medication Sig Start Date End Date Taking? Authorizing Provider  ciprofloxacin-dexamethasone (CIPRODEX) OTIC suspension Place 4 drops into the left ear 2 (two) times daily. 01/31/17   Azalia Bilisampos, Latrice Storlie, MD  HYDROcodone-acetaminophen (NORCO/VICODIN) 5-325 MG tablet Take 1 tablet by mouth every 4 (four) hours as needed for moderate pain. 01/31/17   Azalia Bilisampos, [place] Flett, MD  ibuprofen (ADVIL,MOTRIN) 400 MG tablet Take 1 tablet (400 mg total) by mouth every 8 (eight) hours as needed. 01/31/17   Azalia Bilisampos, Janayla Marik, MD    Family History No family history on file.  Social History Social History  Substance Use Topics  . Smoking status: Never Smoker  . Smokeless tobacco: Never Used  . Alcohol use No     Allergies   Patient has no  known allergies.   Review of Systems Review of Systems  All other systems reviewed and are negative.    Physical Exam Updated Vital Signs BP (!) 157/94 (BP Location: Right Arm)   Pulse 78   Temp 98.3 F (36.8 C) (Oral)   Resp 18   Ht 5\' 10"  (1.778 m)   Wt 110.7 kg (244 lb)   SpO2 96%   BMI 35.01 kg/m   Physical Exam  Constitutional: He is oriented to person, place, and time. He appears well-developed and well-nourished.  HENT:  Swelling of the left preauricular space.  No mastoid tenderness on the left.  Small shotty cervical lymphadenopathy on the left.  No trismus or malocclusion.  Unable to visualize left TM secondary to significant swelling and discharge from the left external auditory canal  Eyes: EOM are normal.  Neck: Normal range of motion.  Pulmonary/Chest: Effort normal.  Abdominal: He exhibits no distension.  Musculoskeletal: Normal range of motion.  Neurological: He is alert and oriented to person, place, and time.  Psychiatric: He has a normal mood and affect.  Nursing note and vitals reviewed.    ED Treatments / Results  Labs (all labs ordered are listed, but only abnormal results are displayed) Labs Reviewed - No data to display  EKG  EKG Interpretation None       Radiology No results found.  Procedures Procedures (including  critical care time)  Medications Ordered in ED Medications  ibuprofen (ADVIL,MOTRIN) tablet 800 mg (800 mg Oral Given 01/30/17 2311)  HYDROcodone-acetaminophen (NORCO/VICODIN) 5-325 MG per tablet 1 tablet (1 tablet Oral Given 01/31/17 0112)  acetaminophen (TYLENOL) tablet 650 mg (650 mg Oral Given 01/31/17 0112)  ciprofloxacin-dexamethasone (CIPRODEX) 0.3-0.1 % OTIC (EAR) suspension 4 drop (4 drops Left EAR Given 01/31/17 0114)     Initial Impression / Assessment and Plan / ED Course  I have reviewed the triage vital signs and the nursing notes.  Pertinent labs & imaging results that were available during my care of  the patient were reviewed by me and considered in my medical decision making (see chart for details).     Patient with evidence of acute otitis externa.  Patient be started on Cipro otic drops.  I will discontinue oral amoxicillin at this time.  No signs to suggest mastoiditis.  I recommended that he return to ER for worsening symptoms.  Final Clinical Impressions(s) / ED Diagnoses   Final diagnoses:  Acute diffuse otitis externa of left ear    New Prescriptions Discharge Medication List as of 01/31/2017  1:11 AM    START taking these medications   Details  ciprofloxacin-dexamethasone (CIPRODEX) OTIC suspension Place 4 drops into the left ear 2 (two) times daily., Starting Fri 01/31/2017, Print    ibuprofen (ADVIL,MOTRIN) 400 MG tablet Take 1 tablet (400 mg total) by mouth every 8 (eight) hours as needed., Starting Fri 01/31/2017, Print         Azalia Bilis, MD 01/31/17 [phone]

## 2017-02-01 ENCOUNTER — Emergency Department (HOSPITAL_BASED_OUTPATIENT_CLINIC_OR_DEPARTMENT_OTHER): Payer: BLUE CROSS/BLUE SHIELD

## 2017-02-01 ENCOUNTER — Encounter (HOSPITAL_BASED_OUTPATIENT_CLINIC_OR_DEPARTMENT_OTHER): Payer: Self-pay

## 2017-02-01 ENCOUNTER — Emergency Department (HOSPITAL_BASED_OUTPATIENT_CLINIC_OR_DEPARTMENT_OTHER)
Admission: EM | Admit: 2017-02-01 | Discharge: 2017-02-01 | Disposition: A | Discharge status: Home / Self Care | Payer: BLUE CROSS/BLUE SHIELD | Attending: Emergency Medicine | Admitting: Emergency Medicine

## 2017-02-01 DIAGNOSIS — J45909 Unspecified asthma, uncomplicated: Secondary | ICD-10-CM | POA: Insufficient documentation

## 2017-02-01 DIAGNOSIS — H9202 Otalgia, left ear: Secondary | ICD-10-CM

## 2017-02-01 DIAGNOSIS — H6092 Unspecified otitis externa, left ear: Secondary | ICD-10-CM | POA: Diagnosis not present

## 2017-02-01 DIAGNOSIS — H60502 Unspecified acute noninfective otitis externa, left ear: Secondary | ICD-10-CM

## 2017-02-01 DIAGNOSIS — R220 Localized swelling, mass and lump, head: Secondary | ICD-10-CM | POA: Diagnosis not present

## 2017-02-01 DIAGNOSIS — H60512 Acute actinic otitis externa, left ear: Secondary | ICD-10-CM | POA: Insufficient documentation

## 2017-02-01 MED ORDER — HYDROCODONE-ACETAMINOPHEN 10-325 MG PO TABS
1.0000 | ORAL_TABLET | Freq: Once | ORAL | Status: DC
  Filled 2017-02-01: qty 1

## 2017-02-01 MED ORDER — TRAMADOL HCL 50 MG PO TABS: 50.0000 mg | ORAL_TABLET | Freq: Four times a day (QID) | ORAL | 0 refills | Status: AC | PRN

## 2017-02-01 MED ORDER — SODIUM CHLORIDE 0.9 % IV SOLN
3.0000 g | Freq: Once | INTRAVENOUS | Status: AC
  Administered 2017-02-01: 3 g via INTRAVENOUS
  Filled 2017-02-01: qty 3

## 2017-02-01 MED ORDER — AMOXICILLIN-POT CLAVULANATE 875-125 MG PO TABS: 1.0000 | ORAL_TABLET | Freq: Two times a day (BID) | ORAL | 0 refills | Status: AC

## 2017-02-01 MED ORDER — AMOXICILLIN-POT CLAVULANATE 875-125 MG PO TABS: 1.0000 | ORAL_TABLET | Freq: Two times a day (BID) | ORAL | 0 refills | Status: DC

## 2017-02-01 MED ORDER — IOPAMIDOL (ISOVUE-300) INJECTION 61%
100.0000 mL | Freq: Once | INTRAVENOUS | Status: AC | PRN
  Administered 2017-02-01: 100 mL via INTRAVENOUS

## 2017-02-01 MED ORDER — MORPHINE SULFATE (PF) 4 MG/ML IV SOLN
4.0000 mg | Freq: Once | INTRAVENOUS | Status: AC
  Administered 2017-02-01: 4 mg via INTRAVENOUS
  Filled 2017-02-01: qty 1

## 2017-02-01 MED ORDER — TRAMADOL HCL 50 MG PO TABS
50.0000 mg | ORAL_TABLET | Freq: Once | ORAL | Status: AC
  Administered 2017-02-01: 50 mg via ORAL
  Filled 2017-02-01: qty 1

## 2017-02-01 MED ORDER — HYDROCODONE-ACETAMINOPHEN 5-325 MG PO TABS
2.0000 | ORAL_TABLET | Freq: Once | ORAL | Status: AC
  Administered 2017-02-01: 2 via ORAL
  Filled 2017-02-01: qty 2

## 2017-02-01 NOTE — Discharge Instructions (Addendum)
We will send you home on Augmentin you will take it two times a day for 7 days. Please return if pain does not improve, or increase swelling, you develop fever, nausea,vomiting and other symptoms concerning for worsening infection. Please continue to use your ciprofloxacin ear drops but increase regimen from two to three times a day.

## 2017-02-01 NOTE — ED Notes (Signed)
Pt back from CT

## 2017-02-01 NOTE — ED Notes (Signed)
Patient transported to CT 

## 2017-02-01 NOTE — ED Provider Notes (Signed)
[facility] Provider Note   CSN: [id number] Arrival date & time: 02/01/17  1332     History   Chief Complaint Chief Complaint  Patient presents with  . Otalgia    HPI Brent Gilmore is a 21 y.o. male who present today with left ear pain for the past two days. Patient was seen in [facility] ED for ear pain and was diagnosed with otitis externa and started on ciprofloxacin drops. Prior to ciprofloxacin, patient was on Amoxicillin for 2 days. Since Thursday patient reports worsening pain for which he has been taking Vicodin which was prescribed to him in the ED on 7/12. Patient endorses some clear drainage from left ear and jaw swelling. Patient denies any fever, chills, drooling, increase salivation, nausea and vomiting. Poor appetite given jaw pain.   HPI  Past Medical History:  Diagnosis Date  . Finger mass, right 06/2016   thumb  . History of asthma    allergy-induced; no current med.  . Seasonal allergies     There are no active problems to display for this patient.   Past Surgical History:  Procedure Laterality Date  . MASS EXCISION Right 07/02/2016   Procedure: EXCISION MASS right thumb;  Surgeon: Cindee SaltGary Kuzma, MD;  Location: [facility];  Service: Orthopedics;  Laterality: Right;  FAB  . TYMPANOPLASTY WITH GRAFT Right   . TYMPANOSTOMY TUBE PLACEMENT Bilateral        Home Medications    Prior to Admission medications   Medication Sig Start Date End Date Taking? Authorizing Provider  ciprofloxacin-dexamethasone (CIPRODEX) OTIC suspension Place 4 drops into the left ear 2 (two) times daily. 01/31/17  Yes Azalia Bilisampos, Kevin, MD  HYDROcodone-acetaminophen (NORCO/VICODIN) 5-325 MG tablet Take 1 tablet by mouth every 4 (four) hours as needed for moderate pain. 01/31/17  Yes Azalia Bilisampos, Kevin, MD  ibuprofen (ADVIL,MOTRIN) 400 MG tablet Take 1 tablet (400 mg total) by mouth every 8 (eight) hours as needed. 01/31/17  Yes Azalia Bilisampos, Kevin, MD    amoxicillin-clavulanate (AUGMENTIN) 875-125 MG tablet Take 1 tablet by mouth 2 (two) times daily. 02/01/17 02/08/17  Ricca Melgarejo, Lilia ArgueAbdoulaye, MD  traMADol (ULTRAM) 50 MG tablet Take 1 tablet (50 mg total) by mouth every 6 (six) hours as needed. 02/01/17 02/04/17  Lovena Neighboursiallo, Nuri Larmer, MD    Family History History reviewed. No pertinent family history.  Social History Social History  Substance Use Topics  . Smoking status: Never Smoker  . Smokeless tobacco: Never Used  . Alcohol use No     Allergies   Patient has no known allergies.   Review of Systems Review of Systems  Constitutional: Positive for appetite change.  HENT: Positive for ear pain and facial swelling. Negative for drooling.   Eyes: Negative.   Respiratory: Negative.   Cardiovascular: Negative.   Gastrointestinal: Negative.   Endocrine: Negative.   Genitourinary: Negative.   Musculoskeletal: Negative.   Skin: Negative.   Neurological: Negative.   Hematological: Negative.   Psychiatric/Behavioral: Negative.      Physical Exam Updated Vital Signs BP (!) 142/89 (BP Location: Right Arm)   Pulse 69   Temp 97.9 F (36.6 C) (Oral)   Resp 18   SpO2 99%   Physical Exam  Constitutional: He appears well-developed.  HENT:  Head: Normocephalic and atraumatic.  Right Ear: External ear normal.  Left Ear: There is drainage, swelling and tenderness.  Mouth/Throat: Oropharynx is clear and moist.  Swollen left ear external canal consistent with otitis externa. No draining  fluid noted during exam. Tenderness with tragus push and pinna downward pull. Left jaw is mildly swollen and tender to palpation. Decrease ability to open mouth due to pain.uvula is midline, no mouth sore.  Eyes: Pupils are equal, round, and reactive to light.  Neck: Normal range of motion. Neck supple.  Cardiovascular: Normal rate and regular rhythm.   Pulmonary/Chest: Effort normal and breath sounds normal.  Abdominal: Soft. Bowel sounds are normal.   Musculoskeletal: Normal range of motion.  Skin: Skin is warm and dry.     ED Treatments / Results  Labs (all labs ordered are listed, but only abnormal results are displayed) Labs Reviewed - No data to display  EKG  EKG Interpretation None       Radiology Ct Maxillofacial W Contrast  Result Date: 02/01/2017 CLINICAL DATA:  Left ear pain and swelling. Left jaw pain. Right-sided tympanic plasty. Bilateral tympanostomy. EXAM: CT MAXILLOFACIAL WITH CONTRAST TECHNIQUE: Multidetector CT imaging of the maxillofacial structures was performed. Multiplanar CT image reconstructions were also generated. A small metallic BB was placed on the right temple in order to reliably differentiate right from left. CONTRAST:  ISOVUE-300 IOPAMIDOL (ISOVUE-300) INJECTION 61% COMPARISON:  None. FINDINGS: Osseous: No focal osseous lesion is present. No acute or healing fracture is present. Orbits: The globes and orbits are within normal limits bilaterally. Sinuses: Polyps or mucous retention cysts are noted along the inferior maxillary sinuses bilaterally, more prominent on the left. Do no significant mucosal disease or fluid is present. The mastoid air cells are clear. Soft tissues: Extensive edema surrounds the left external auditory canal. This extends to the intraosseous portion of the external auditory canal as well. No discrete abscess is present. Soft tissue swelling is noted below the ear to the level of the parotid. Asymmetric left level 2 lymph nodes are likely reactive. Prominent lingual tonsils and palatine tonsils are present bilaterally. Limited intracranial: Within normal limits. IMPRESSION: Marked soft tissue swelling involving the left external auditory canal compatible with otitis externa. No abscess is evident. Soft tissue swelling extends inferiorly to the level of the parotid. Asymmetric left level 2 lymph nodes are likely reactive. Prominent lymphoid tissue in the oropharynx. Question acute  pharyngitis as well. Electronically Signed   By: Marin Roberts M.D.   On: 02/01/2017 19:36    Procedures Procedures (including critical care time)  Medications Ordered in ED Medications  HYDROcodone-acetaminophen (NORCO) 10-325 MG per tablet 1 tablet (not administered)  Ampicillin-Sulbactam (UNASYN) 3 g in sodium chloride 0.9 % 100 mL IVPB (0 g Intravenous Stopped 02/01/17 1815)  traMADol (ULTRAM) tablet 50 mg (50 mg Oral Given 02/01/17 1716)  morphine 4 MG/ML injection 4 mg (4 mg Intravenous Given 02/01/17 1851)  iopamidol (ISOVUE-300) 61 % injection 100 mL (100 mLs Intravenous Contrast Given 02/01/17 1911)     Initial Impression / Assessment and Plan / ED Course  Patient is a 21 yo male who presents with left ear and jaw pain in the setting of recent otitis externa infection diagnosed on 7/12. Patient was discharged on ciprofloxacin ear drops but reported worsened symptoms. Clinical presentation is consistent with failed outpatient therapy. Given pain and significant swelling noted since last ED visit, CT Maxillofacial was ordered to rule out , abscess, mastoid process or parotid infection. Imaging results showed soft tissue swelling consistent with otitis media and some reactive lymph node. Patient received a one time dose of Unasyn and  was discharged patient on 7 day course of Augmentin for possible otitis media and  will continue with ciprofloxacin ear drops. Return precaution given, patient in agreement with plan.  I have reviewed the triage vital signs and the nursing notes.  Pertinent labs & imaging results that were available during my care of the patient were reviewed by me and considered in my medical decision making (see chart for details).     Final Clinical Impressions(s) / ED Diagnoses   Final diagnoses:  Acute otitis externa of left ear, unspecified type  Otalgia of left ear    New Prescriptions New Prescriptions   AMOXICILLIN-CLAVULANATE (AUGMENTIN) 875-125 MG  TABLET    Take 1 tablet by mouth 2 (two) times daily.   TRAMADOL (ULTRAM) 50 MG TABLET    Take 1 tablet (50 mg total) by mouth every 6 (six) hours as needed.     Lovena Neighbours, MD 02/01/17 2025    Geoffery Lyons, MD 02/02/17 0003

## 2017-02-01 NOTE — ED Triage Notes (Signed)
PT reports left ear pain not improved after Cipro gtts initiated on Thursday - states instructed to stop oral ABT at that time. Reports increased pain, swelling that has increased, constant left jaw pain.

## 2017-02-01 NOTE — ED Notes (Addendum)
Patient was here last Thursday for the same problem,  Ear pain that radiates to his left jaw, left face.  He was on an ear drop antibiotic.  He claimed that the med did not help.  Slight drainage to left ear noted.

## 2017-02-01 NOTE — ED Notes (Signed)
Patient stated that he felt a lot of pressure to his left side of his ear.  IV antibiotic completed.

## 2017-02-12 IMAGING — MR MR [PERSON_NAME]*[PERSON_NAME]* WO/W CM
8 series · 40 of 40 positions shown · IV contrast (multihance)
Comparison: None.

CLINICAL DATA: Chronic medial thumb mass.

EXAM:
MRI OF THE RIGHT THUMB WITHOUT AND WITH CONTRAST
TECHNIQUE: Multiplanar, multisequence MR imaging of the thumb was performed
before and after the administration of intravenous contrast.
CONTRAST:  20mL MULTIHANCE GADOBENATE DIMEGLUMINE 529 MG/ML IV SOLN

[Series 5: t1_ax · axial · right · 3.0mm · 0.31mm/px · z∈[-75,+45]mm · 7 of 41 slices shown]
[im 1/41]
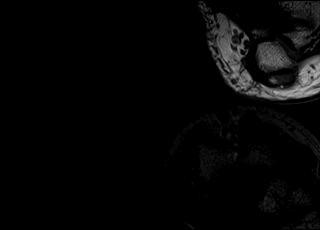
[im 7/41]
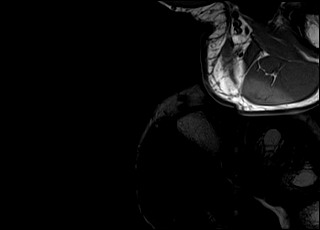
[im 14/41]
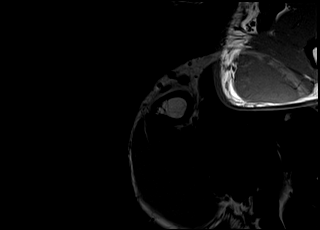
[im 21/41]
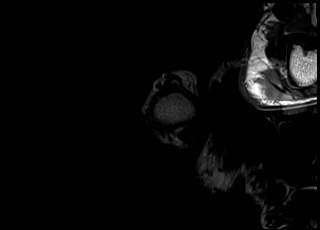
[im 27/41]
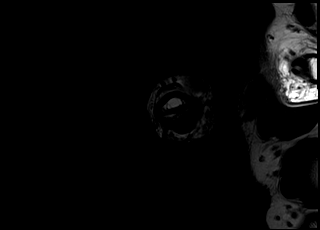
[im 34/41]
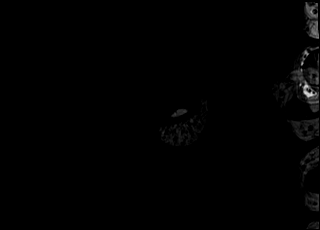
[im 41/41]
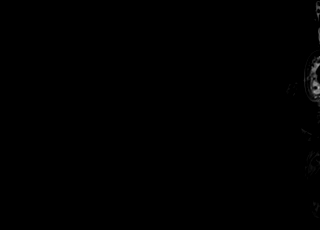

[Series 6: T2 fat-sat · axial · right · 3.0mm · 0.31mm/px · z∈[-75,+45]mm · 7 of 41 slices shown (1 of 3)]
[im 1/41]
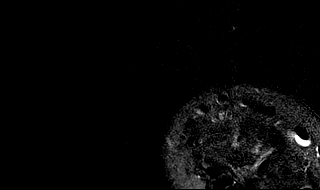
[im 7/41]
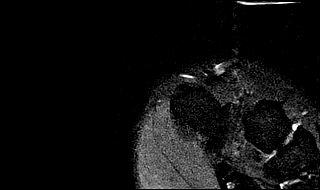
[im 14/41]
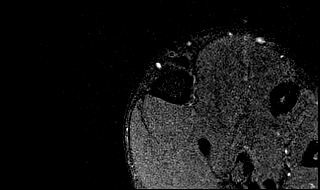
[im 21/41]
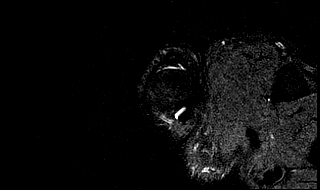
[im 27/41]
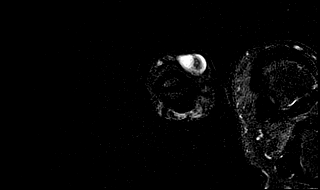
[im 34/41]
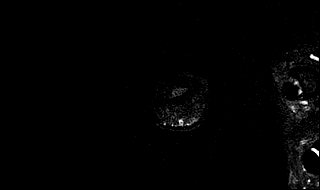
[im 41/41]
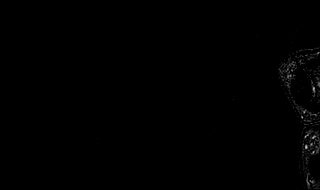

[Series 7: T2 fat-sat · coronal · right · 2.0mm · 0.23mm/px · 3 of 16 slices shown (2 of 3)]
[im 1/16]
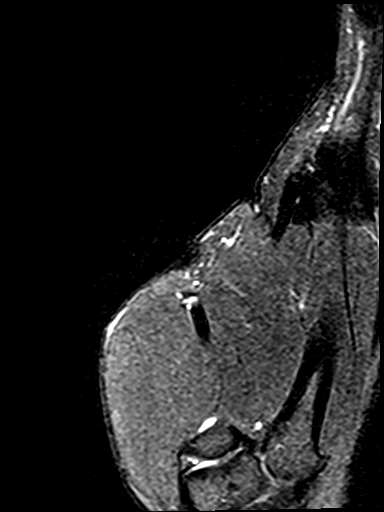
[im 8/16]
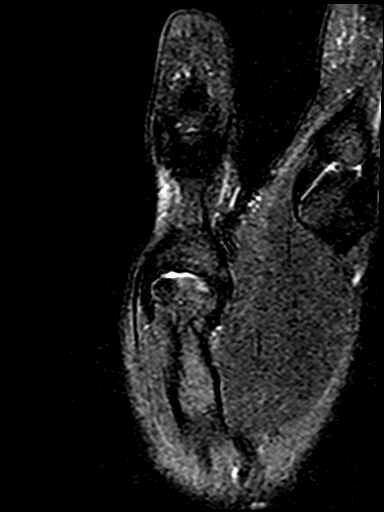
[im 16/16]
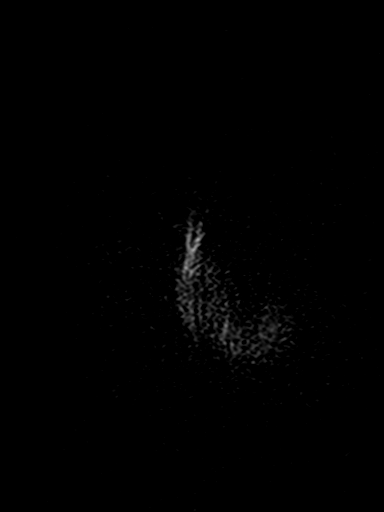

[Series 8: t1_ax fs · axial · right · 3.0mm · 0.31mm/px · z∈[-75,+45]mm · 7 of 41 slices shown]
[im 1/41]
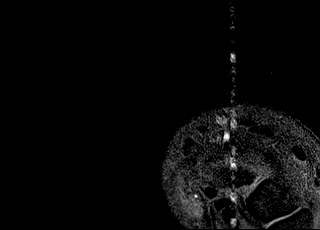
[im 7/41]
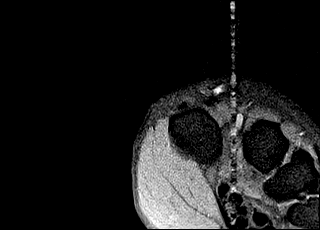
[im 14/41]
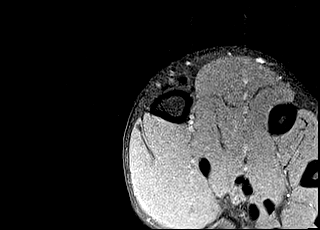
[im 21/41]
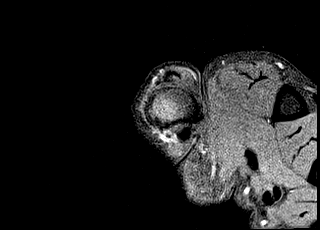
[im 27/41]
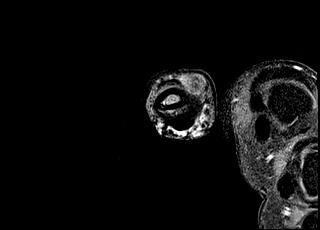
[im 34/41]
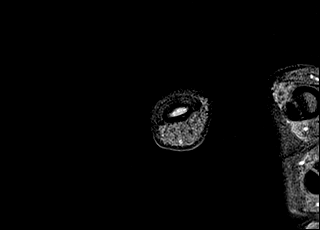
[im 41/41]
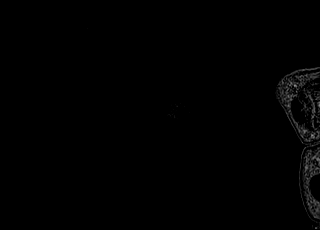

[Series 9: T1 · coronal · right · 2.0mm · 0.23mm/px · 3 of 16 slices shown (1 of 2)]
[im 1/16]
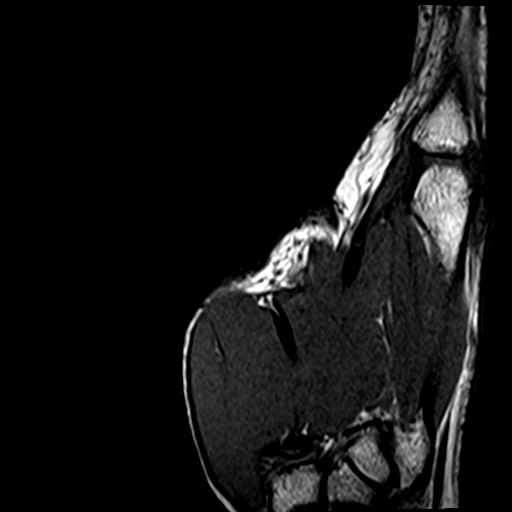
[im 8/16]
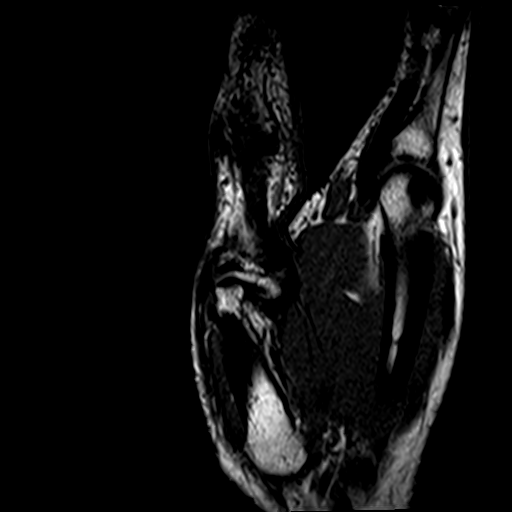
[im 16/16]
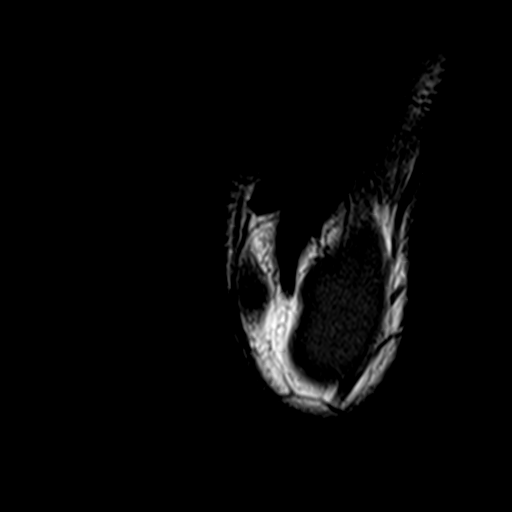

[Series 11: T2 fat-sat · sagittal · right · 2.0mm · 0.23mm/px · 3 of 16 slices shown (3 of 3)]
[im 1/16]
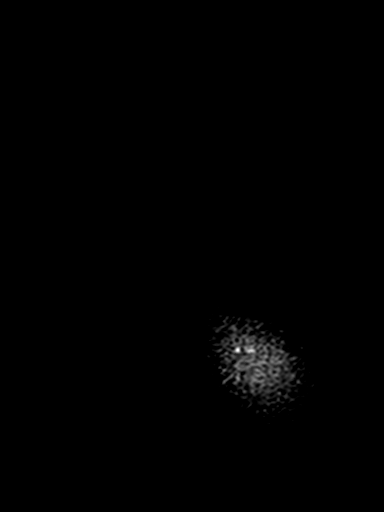
[im 8/16]
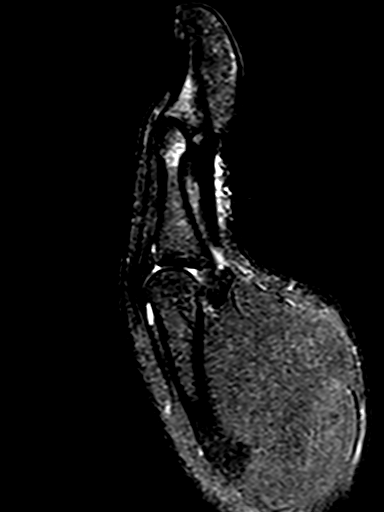
[im 16/16]
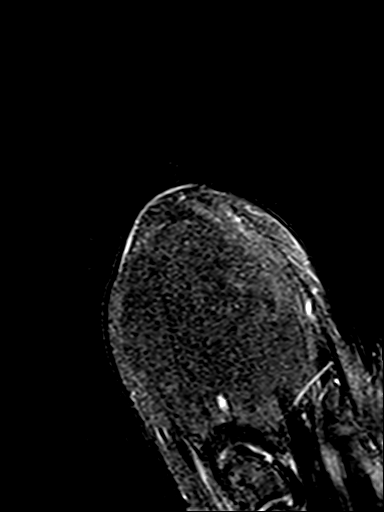

[Series 12: t1_ax fs post · axial · right · 3.0mm · 0.31mm/px · z∈[-75,+45]mm · 7 of 41 slices shown]
[im 1/41]
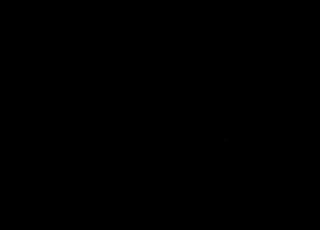
[im 7/41]
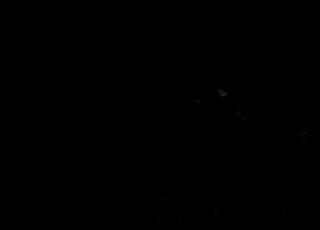
[im 14/41]
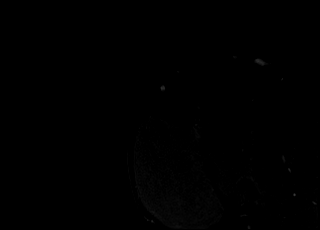
[im 21/41]
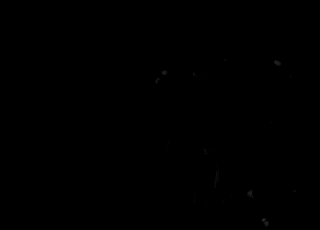
[im 27/41]
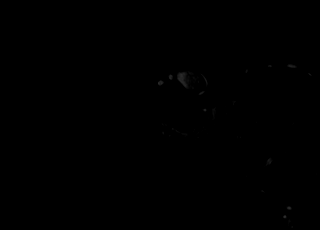
[im 34/41]
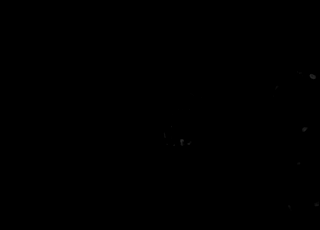
[im 41/41]
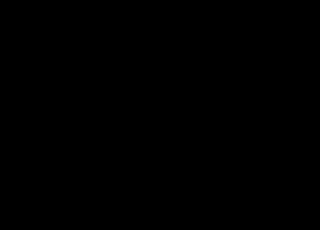

[Series 13: T1 · coronal · right · 2.0mm · 0.23mm/px · 3 of 16 slices shown (2 of 2)]
[im 1/16]
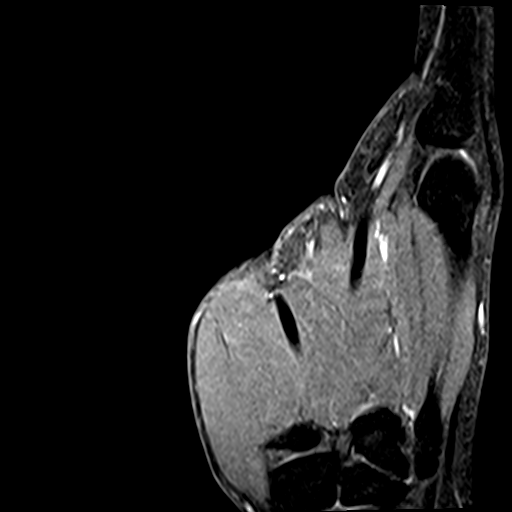
[im 8/16]
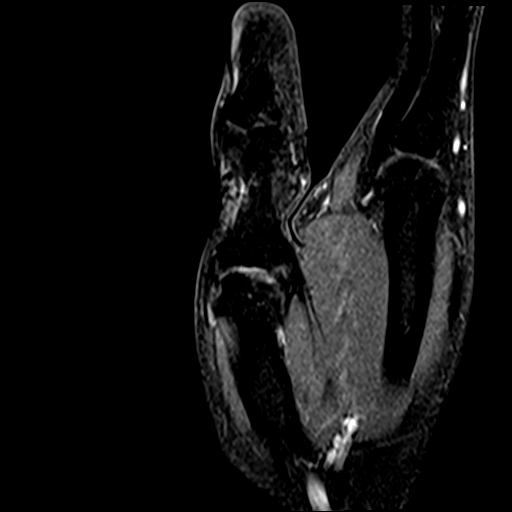
[im 16/16]
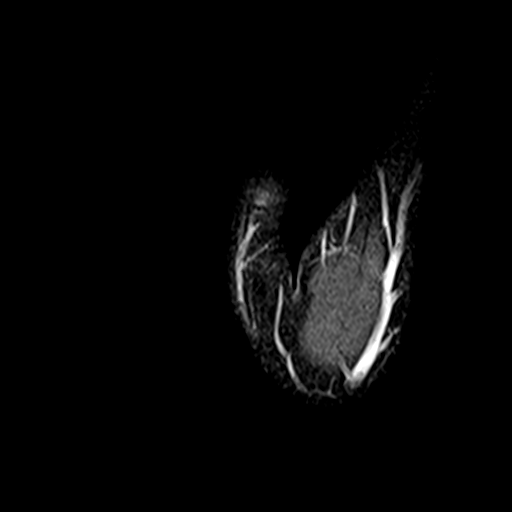

[40 of 40 positions shown; findings below may reference images not displayed]

FINDINGS: Soft tissues

There is a 12 x 11 x 8 mm well-circumscribed nodule in the
subcutaneous fat at the ulnar aspect of mid point of the proximal
phalanx of the thumb. The lesion is slightly higher than the muscle
signal intensity on T1 weighted imaging, central low signal and
peripheral high signal on T2 and has peripheral enhancement on
postcontrast imaging. The appearance is nonspecific but most typical
for an epidermoid cyst. Less likely considerations would be giant
cell tumor of the tendon sheath or a fibroma of the tendon sheath.
Characteristics are consistent with a benign lesion.

Bones/Joint/Cartilage

There is no abnormality in the adjacent phalangeal bone.

Muscles and Tendons

The adjacent tendons appear normal.  Joints appear normal.
IMPRESSION: 12 x 11 x 8 mm nodule in the subcutaneous fat bone, most likely an
epidermoid cyst.

## 2017-05-07 DIAGNOSIS — J101 Influenza due to other identified influenza virus with other respiratory manifestations: Secondary | ICD-10-CM | POA: Diagnosis not present

## 2017-05-07 DIAGNOSIS — R509 Fever, unspecified: Secondary | ICD-10-CM | POA: Diagnosis not present

## 2019-05-03 DIAGNOSIS — Z20828 Contact with and (suspected) exposure to other viral communicable diseases: Secondary | ICD-10-CM | POA: Diagnosis not present

## 2019-05-03 DIAGNOSIS — R509 Fever, unspecified: Secondary | ICD-10-CM | POA: Diagnosis not present

## 2019-05-25 DIAGNOSIS — Z20828 Contact with and (suspected) exposure to other viral communicable diseases: Secondary | ICD-10-CM | POA: Diagnosis not present

## 2019-05-25 DIAGNOSIS — Z6835 Body mass index (BMI) 35.0-35.9, adult: Secondary | ICD-10-CM | POA: Diagnosis not present

## 2019-05-27 DIAGNOSIS — R5381 Other malaise: Secondary | ICD-10-CM | POA: Diagnosis not present

## 2019-05-27 DIAGNOSIS — J029 Acute pharyngitis, unspecified: Secondary | ICD-10-CM | POA: Diagnosis not present

## 2019-08-12 DIAGNOSIS — R109 Unspecified abdominal pain: Secondary | ICD-10-CM | POA: Diagnosis not present

## 2019-08-12 DIAGNOSIS — R112 Nausea with vomiting, unspecified: Secondary | ICD-10-CM | POA: Diagnosis not present

## 2019-09-02 DIAGNOSIS — R05 Cough: Secondary | ICD-10-CM | POA: Diagnosis not present

## 2019-09-02 DIAGNOSIS — J3489 Other specified disorders of nose and nasal sinuses: Secondary | ICD-10-CM | POA: Diagnosis not present

## 2019-11-15 ENCOUNTER — Other Ambulatory Visit (HOSPITAL_BASED_OUTPATIENT_CLINIC_OR_DEPARTMENT_OTHER): Payer: Self-pay | Admitting: Family Medicine

## 2019-11-15 DIAGNOSIS — R109 Unspecified abdominal pain: Secondary | ICD-10-CM | POA: Diagnosis not present

## 2019-11-15 DIAGNOSIS — K760 Fatty (change of) liver, not elsewhere classified: Secondary | ICD-10-CM | POA: Diagnosis not present

## 2019-11-17 ENCOUNTER — Other Ambulatory Visit: Payer: Self-pay

## 2019-11-17 ENCOUNTER — Ambulatory Visit (HOSPITAL_BASED_OUTPATIENT_CLINIC_OR_DEPARTMENT_OTHER)
Admission: RE | Admit: 2019-11-17 | Discharge: 2019-11-17 | Disposition: A | Discharge status: Home / Self Care | Payer: BC Managed Care – PPO | Source: Ambulatory Visit | Attending: Family Medicine | Admitting: Family Medicine

## 2019-11-17 DIAGNOSIS — R109 Unspecified abdominal pain: Secondary | ICD-10-CM

## 2019-12-07 DIAGNOSIS — S2020XA Contusion of thorax, unspecified, initial encounter: Secondary | ICD-10-CM | POA: Diagnosis not present

## 2019-12-15 DIAGNOSIS — Z23 Encounter for immunization: Secondary | ICD-10-CM | POA: Diagnosis not present

## 2020-03-02 DIAGNOSIS — R05 Cough: Secondary | ICD-10-CM | POA: Diagnosis not present

## 2020-05-31 DIAGNOSIS — Z20822 Contact with and (suspected) exposure to covid-19: Secondary | ICD-10-CM | POA: Diagnosis not present

## 2020-07-29 ENCOUNTER — Other Ambulatory Visit: Payer: BC Managed Care – PPO
